# Patient Record
Sex: Male | Born: 1970 | Race: White | Hispanic: No | Marital: Married | Smoking: Never smoker
Health system: Southern US, Community
[De-identification: ages and names within clinical notes are randomized; demographics above are authoritative.]

## PROBLEM LIST (undated history)

## (undated) DIAGNOSIS — I10 Essential (primary) hypertension: Secondary | ICD-10-CM

## (undated) HISTORY — PX: SHOULDER SURGERY: SHX246

## (undated) HISTORY — PX: APPENDECTOMY: SHX54

---

## 2015-01-02 ENCOUNTER — Emergency Department (HOSPITAL_COMMUNITY)
Admission: EM | Admit: 2015-01-02 | Discharge: 2015-01-02 | Disposition: A | Discharge status: Home / Self Care | Payer: BC Managed Care – PPO | Attending: Emergency Medicine | Admitting: Emergency Medicine

## 2015-01-02 ENCOUNTER — Emergency Department (HOSPITAL_COMMUNITY): Payer: BC Managed Care – PPO

## 2015-01-02 ENCOUNTER — Encounter (HOSPITAL_COMMUNITY): Payer: Self-pay | Admitting: Emergency Medicine

## 2015-01-02 DIAGNOSIS — S4991XA Unspecified injury of right shoulder and upper arm, initial encounter: Secondary | ICD-10-CM | POA: Diagnosis present

## 2015-01-02 DIAGNOSIS — S43004A Unspecified dislocation of right shoulder joint, initial encounter: Secondary | ICD-10-CM | POA: Diagnosis not present

## 2015-01-02 DIAGNOSIS — Z79899 Other long term (current) drug therapy: Secondary | ICD-10-CM | POA: Diagnosis not present

## 2015-01-02 DIAGNOSIS — T1490XA Injury, unspecified, initial encounter: Secondary | ICD-10-CM

## 2015-01-02 DIAGNOSIS — W1839XA Other fall on same level, initial encounter: Secondary | ICD-10-CM | POA: Diagnosis not present

## 2015-01-02 DIAGNOSIS — M25519 Pain in unspecified shoulder: Secondary | ICD-10-CM

## 2015-01-02 DIAGNOSIS — I10 Essential (primary) hypertension: Secondary | ICD-10-CM | POA: Insufficient documentation

## 2015-01-02 DIAGNOSIS — N289 Disorder of kidney and ureter, unspecified: Secondary | ICD-10-CM | POA: Insufficient documentation

## 2015-01-02 DIAGNOSIS — Y999 Unspecified external cause status: Secondary | ICD-10-CM | POA: Insufficient documentation

## 2015-01-02 DIAGNOSIS — Y9289 Other specified places as the place of occurrence of the external cause: Secondary | ICD-10-CM | POA: Insufficient documentation

## 2015-01-02 DIAGNOSIS — Y9302 Activity, running: Secondary | ICD-10-CM | POA: Diagnosis not present

## 2015-01-02 HISTORY — DX: Essential (primary) hypertension: I10

## 2015-01-02 LAB — CBC WITH DIFFERENTIAL/PLATELET
BASOS PCT: 1 % (ref 0–1)
Basophils Absolute: 0 10*3/uL (ref 0.0–0.1)
EOS ABS: 0.3 10*3/uL (ref 0.0–0.7)
Eosinophils Relative: 4 % (ref 0–5)
HCT: 53.2 % — ABNORMAL HIGH (ref 39.0–52.0)
HEMOGLOBIN: 18.5 g/dL — AB (ref 13.0–17.0)
Lymphocytes Relative: 36 % (ref 12–46)
Lymphs Abs: 3 10*3/uL (ref 0.7–4.0)
MCH: 31 pg (ref 26.0–34.0)
MCHC: 34.8 g/dL (ref 30.0–36.0)
MCV: 89.1 fL (ref 78.0–100.0)
Monocytes Absolute: 0.5 10*3/uL (ref 0.1–1.0)
Monocytes Relative: 6 % (ref 3–12)
Neutro Abs: 4.4 10*3/uL (ref 1.7–7.7)
Neutrophils Relative %: 53 % (ref 43–77)
Platelets: 293 10*3/uL (ref 150–400)
RBC: 5.97 MIL/uL — ABNORMAL HIGH (ref 4.22–5.81)
RDW: 12.4 % (ref 11.5–15.5)
WBC: 8.2 10*3/uL (ref 4.0–10.5)

## 2015-01-02 LAB — I-STAT CHEM 8, ED
BUN: 23 mg/dL — AB (ref 6–20)
Calcium, Ion: 1.2 mmol/L (ref 1.12–1.23)
Chloride: 100 mmol/L — ABNORMAL LOW (ref 101–111)
Creatinine, Ser: 1.6 mg/dL — ABNORMAL HIGH (ref 0.61–1.24)
Glucose, Bld: 121 mg/dL — ABNORMAL HIGH (ref 65–99)
HEMATOCRIT: 58 % — AB (ref 39.0–52.0)
HEMOGLOBIN: 19.7 g/dL — AB (ref 13.0–17.0)
Potassium: 4.2 mmol/L (ref 3.5–5.1)
SODIUM: 140 mmol/L (ref 135–145)
TCO2: 26 mmol/L (ref 0–100)

## 2015-01-02 MED ORDER — SODIUM CHLORIDE 0.9 % IV BOLUS (SEPSIS)
1000.0000 mL | Freq: Once | INTRAVENOUS | Status: AC
  Administered 2015-01-02: 1000 mL via INTRAVENOUS

## 2015-01-02 MED ORDER — PROPOFOL 10 MG/ML IV BOLUS
100.0000 mg | Freq: Once | INTRAVENOUS | Status: AC
  Administered 2015-01-02: 50 mg via INTRAVENOUS
  Filled 2015-01-02: qty 1

## 2015-01-02 MED ORDER — PROPOFOL 10 MG/ML IV BOLUS
INTRAVENOUS | Status: AC | PRN
  Administered 2015-01-02: 25 mg via INTRAVENOUS

## 2015-01-02 MED ORDER — OXYCODONE-ACETAMINOPHEN 5-325 MG PO TABS: 1.0000 | ORAL_TABLET | ORAL | Status: DC | PRN

## 2015-01-02 MED ORDER — NAPROXEN 500 MG PO TABS: 500.0000 mg | ORAL_TABLET | Freq: Two times a day (BID) | ORAL | Status: AC

## 2015-01-02 MED ORDER — SODIUM CHLORIDE 0.9 % IV SOLN
Freq: Once | INTRAVENOUS | Status: AC
  Administered 2015-01-02: 75 mL/h via INTRAVENOUS

## 2015-01-02 MED ORDER — FENTANYL CITRATE (PF) 100 MCG/2ML IJ SOLN
100.0000 ug | Freq: Once | INTRAMUSCULAR | Status: AC
  Administered 2015-01-02: 100 ug via INTRAVENOUS
  Filled 2015-01-02: qty 2

## 2015-01-02 MED ORDER — MIDAZOLAM HCL 2 MG/2ML IJ SOLN
2.0000 mg | Freq: Once | INTRAMUSCULAR | Status: AC
  Administered 2015-01-02: 2 mg via INTRAVENOUS
  Filled 2015-01-02: qty 2

## 2015-01-02 MED ORDER — ONDANSETRON HCL 4 MG/2ML IJ SOLN
4.0000 mg | Freq: Once | INTRAMUSCULAR | Status: AC
  Administered 2015-01-02: 4 mg via INTRAVENOUS
  Filled 2015-01-02: qty 2

## 2015-01-02 NOTE — ED Notes (Signed)
MD at bedside. 

## 2015-01-02 NOTE — ED Notes (Signed)
Printed patient a copy of his lab results from today and left on bedside table.

## 2015-01-02 NOTE — ED Notes (Signed)
Patient transported to X-ray 

## 2015-01-02 NOTE — ED Provider Notes (Signed)
CSN: [id number]     Arrival date & time 01/02/15  1501 History   First MD Initiated Contact with Patient 01/02/15 1503     Chief Complaint  Patient presents with  . Fall  . Shoulder Pain     (Consider location/radiation/quality/duration/timing/severity/associated sxs/prior Treatment) HPI  The pt is a 44 y/o male - remote history of shoulder fracture in the past - had a fall today when he was running away from [place] - he tripped forward and FOOSH injury falling with acute onset of pain to the R shoulder with deformity.  Pain is constant, worse with ROM and assocaited with tingling in the fingertips.  No tx pta.  Past Medical History  Diagnosis Date  . Hypertension    Past Surgical History  Procedure Laterality Date  . Appendectomy     No family history on file. History  Substance Use Topics  . Smoking status: Never Smoker   . Smokeless tobacco: Not on file  . Alcohol Use: No    Review of Systems  Musculoskeletal: Positive for joint swelling. Negative for back pain and neck pain.  Skin: Negative for rash and wound.  Neurological: Positive for numbness. Negative for weakness and headaches.      Allergies  Review of patient's allergies indicates no known allergies.  Home Medications   Prior to Admission medications   Medication Sig Start Date End Date Taking? Authorizing Provider  lisinopril (PRINIVIL,ZESTRIL) 10 MG tablet Take 10 mg by mouth daily.   Yes Historical Provider, MD  Multiple Vitamin (MULTIVITAMIN WITH MINERALS) TABS tablet Take 1 tablet by mouth daily.   Yes Historical Provider, MD  Omega-3 Fatty Acids (FISH OIL PO) Take 5 capsules by mouth daily.   Yes Historical Provider, MD  naproxen (NAPROSYN) 500 MG tablet Take 1 tablet (500 mg total) by mouth 2 (two) times daily with a meal. 01/02/15   Eber HongBrian Carolan Avedisian, MD  oxyCODONE-acetaminophen (PERCOCET) 5-325 MG per tablet Take 1 tablet by mouth every 4 (four) hours as needed. 01/02/15   Eber HongBrian Onie Kasparek, MD   BP 117/81  mmHg  Pulse 74  Temp(Src) 97.9 F (36.6 C) (Oral)  Resp 19  Ht 6' (1.829 m)  Wt 213 lb 3 oz (96.701 kg)  BMI 28.91 kg/m2  SpO2 99% Physical Exam  Constitutional: He appears well-developed and well-nourished. No distress.  HENT:  Head: Normocephalic and atraumatic.  Mouth/Throat: Oropharynx is clear and moist. No oropharyngeal exudate.  Eyes: Conjunctivae and EOM are normal. Pupils are equal, round, and reactive to light. Right eye exhibits no discharge. Left eye exhibits no discharge. No scleral icterus.  Neck: Normal range of motion. Neck supple. No JVD present. No thyromegaly present.  Cardiovascular: Normal rate, regular rhythm, normal heart sounds and intact distal pulses.  Exam reveals no gallop and no friction rub.   No murmur heard. Pulmonary/Chest: Effort normal and breath sounds normal. No respiratory distress. He has no wheezes. He has no rales.  Musculoskeletal: He exhibits tenderness ( R shoulder with ttp and dec ROM 2/2 pain - deformity present). He exhibits no edema.  Lymphadenopathy:    He has no cervical adenopathy.  Neurological: He is alert. Coordination normal.  Normal strength at hand and sensation of all dermatomes and nerve distrbution of the hand  Skin: Skin is warm and dry. No rash noted. No erythema.  Psychiatric: He has a normal mood and affect. His behavior is normal.  Nursing note and vitals reviewed.   ED Course  Procedures (including critical care  time) Labs Review Labs Reviewed  CBC WITH DIFFERENTIAL/PLATELET - Abnormal; Notable for the following:    RBC 5.97 (*)    Hemoglobin 18.5 (*)    HCT 53.2 (*)    All other components within normal limits  I-STAT CHEM 8, ED - Abnormal; Notable for the following:    Chloride 100 (*)    BUN 23 (*)    Creatinine, Ser 1.60 (*)    Glucose, Bld 121 (*)    Hemoglobin 19.7 (*)    HCT 58.0 (*)    All other components within normal limits    Imaging Review Dg Shoulder Right  01/02/2015   CLINICAL DATA:   Status post right shoulder dislocation. Interval reduction.  EXAM: RIGHT SHOULDER - 2+ VIEW  COMPARISON:  None.  FINDINGS: Interval successful reduction of glenohumeral dislocation. No acute fracture or persistent dislocation. Slight flattening of the superolateral posterior humeral head likely reflecting an a Hill-Sachs lesion. Degenerative changes of the acromioclavicular joint.  IMPRESSION: Successful reduction of right glenohumeral dislocation.   Electronically Signed   By: Elige Ko   On: 01/02/2015 16:46   Dg Shoulder Right  01/02/2015   CLINICAL DATA:  Right shoulder pain.  Fell on right shoulder.  EXAM: RIGHT SHOULDER - 2+ VIEW  COMPARISON:  None.  FINDINGS: Anterior and inferior dislocation of the right shoulder. Right AC joint appears to be intact. Visualized right ribs are intact. No definite fracture.  IMPRESSION: Anterior dislocation of the right shoulder.   Electronically Signed   By: Richarda Overlie M.D.   On: 01/02/2015 15:34     EKG Interpretation None      MDM   Final diagnoses:  Trauma  Shoulder pain  Dislocation of right shoulder joint, initial encounter  Renal insufficiency    Likely dislocation - needs imaging, pain meds, sedation.    Procedure:  Dislocation Reduction of right Shoulder  Consent:  Description of the procedures as well as Risks of procedure as well as the alternatives and risks of each were explained to the (patient/caregiver).  who has verbally expressed their understanding.   written consent given by  the patient.  Imaging reviewed, anatomic site of dislocation identified and marked, Patient identity confirmed by arm band and with hospital identification number as well as verbally with patient.  Time Out performed .  Patient was prepped and draped in the usual sterile fashion. Sedation used Yes.  .  Sedation type: Moderate.  Type of Sedation used: fentanyl, midazolam and propofol. Total sedation time: 15 minutes.  Reduction method: elvation of arm /  traction.  Vitals were monitored through the procedure.  Complications: none.  Pt tolerated the procedure without complaints and returned to baseline without difficulty.  Post reduction images were obtained confirming successful reduction of dislocation.  Immobilization applied, Neurovascular status reevaluated and is good with normal pulses, sensation and good capillary refill of the affected extremity.  I have personally viewed and interpreted the imaging and agree with radiologist interpretation.    No signs of fracture on postreduction films. Patient given instructions on renal insufficiency and need  for recheck, IV fluids have been given.  Meds given in ED:  Medications  0.9 %  sodium chloride infusion ( Intravenous Stopped 01/02/15 1702)  fentaNYL (SUBLIMAZE) injection 100 mcg (100 mcg Intravenous Given 01/02/15 1550)  midazolam (VERSED) injection 2 mg (2 mg Intravenous Given 01/02/15 1600)  propofol (DIPRIVAN) 10 mg/mL bolus/IV push 100 mg (0 mg Intravenous Stopped 01/02/15 1602)  ondansetron (ZOFRAN) injection  4 mg (4 mg Intravenous Given 01/02/15 1557)  propofol (DIPRIVAN) 10 mg/mL bolus/IV push ( Intravenous Stopped 01/02/15 1606)  sodium chloride 0.9 % bolus 1,000 mL (1,000 mLs Intravenous Bolus from Bag 01/02/15 1702)    New Prescriptions   NAPROXEN (NAPROSYN) 500 MG TABLET    Take 1 tablet (500 mg total) by mouth 2 (two) times daily with a meal.   OXYCODONE-ACETAMINOPHEN (PERCOCET) 5-325 MG PER TABLET    Take 1 tablet by mouth every 4 (four) hours as needed.      Eber Hong, MD 01/02/15 [phone]

## 2015-01-02 NOTE — ED Notes (Signed)
Patient was running and fell onto his right shoulder. Shoulder appears to be dislocated.

## 2015-01-02 NOTE — Discharge Instructions (Signed)
Please call your doctor for a followup appointment within 24-48 hours. When you talk to your doctor please let them know that you were seen in the emergency department and have them acquire all of your records so that they can discuss the findings with you and formulate a treatment plan to fully care for your new and ongoing problems. ° °

## 2016-03-25 IMAGING — CR DG SHOULDER 2+V*R*
2 series · 2 of 2 positions shown · non-contrast
Comparison: None.

CLINICAL DATA: Status post right shoulder dislocation. Interval
reduction.

EXAM:
RIGHT SHOULDER - 2+ VIEW

[x shoulder ap right (1 of 2)]
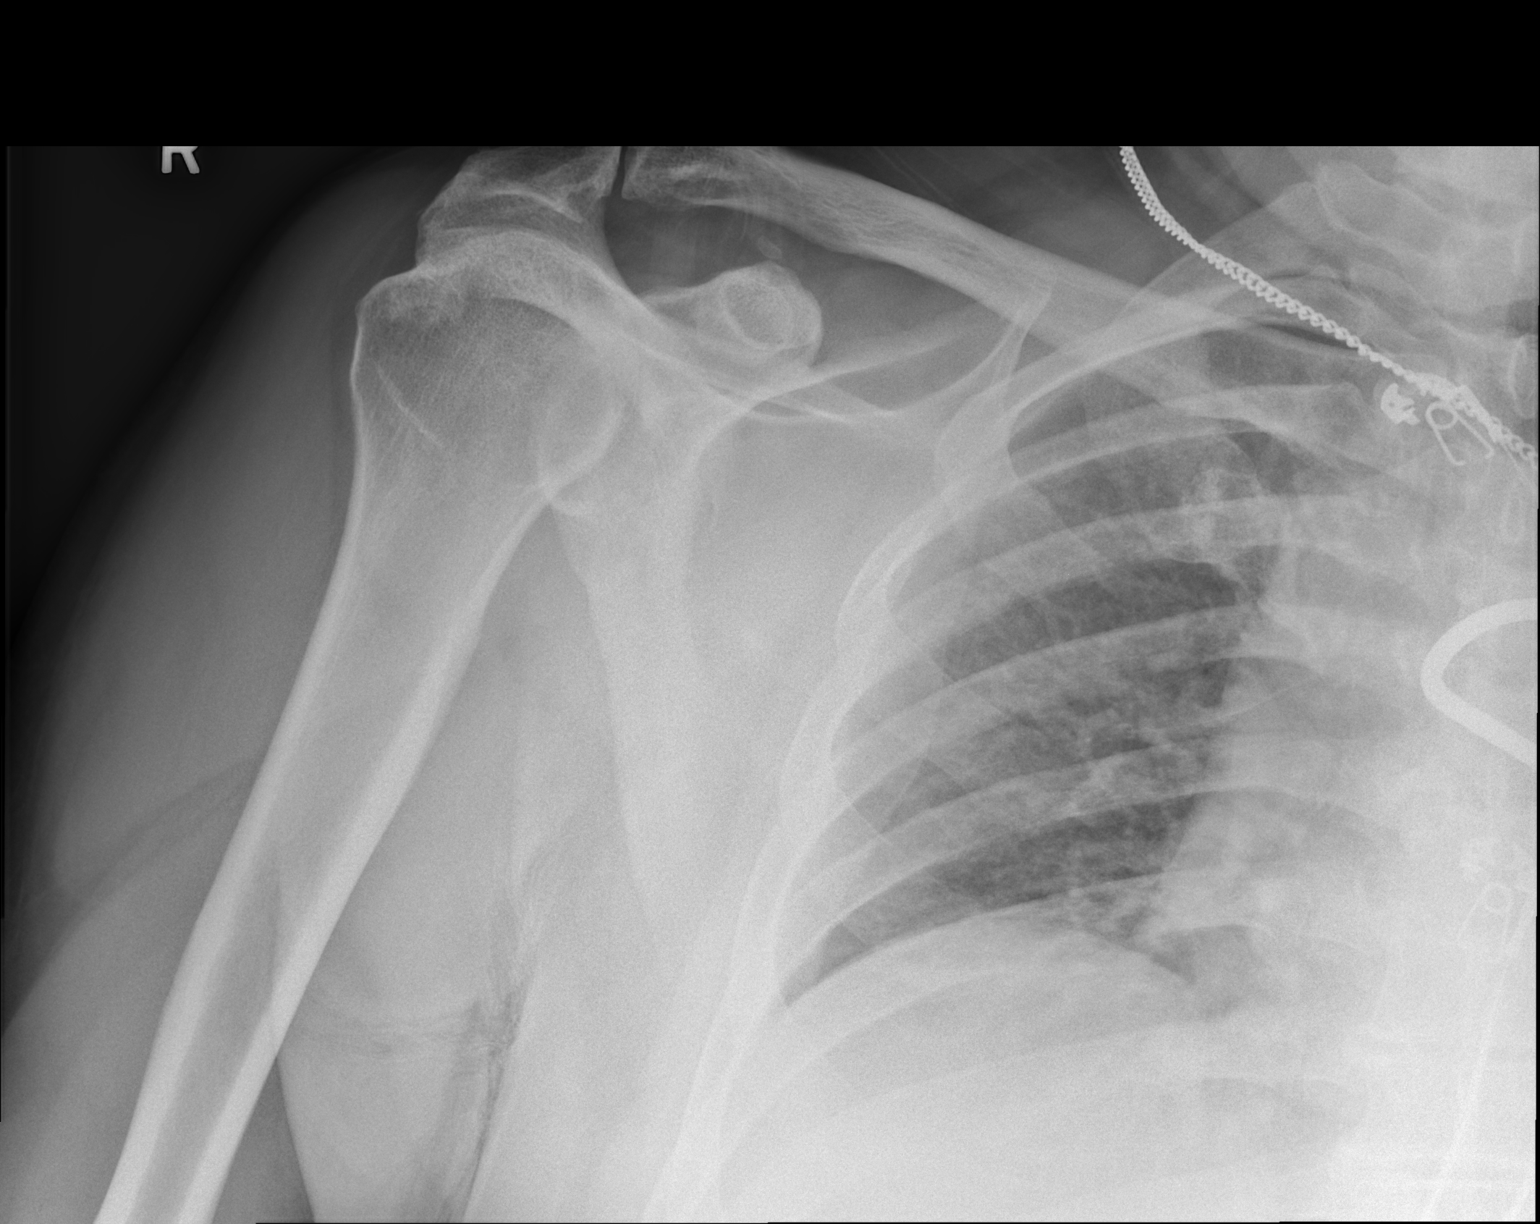

[x shoulder ap right (2 of 2)]
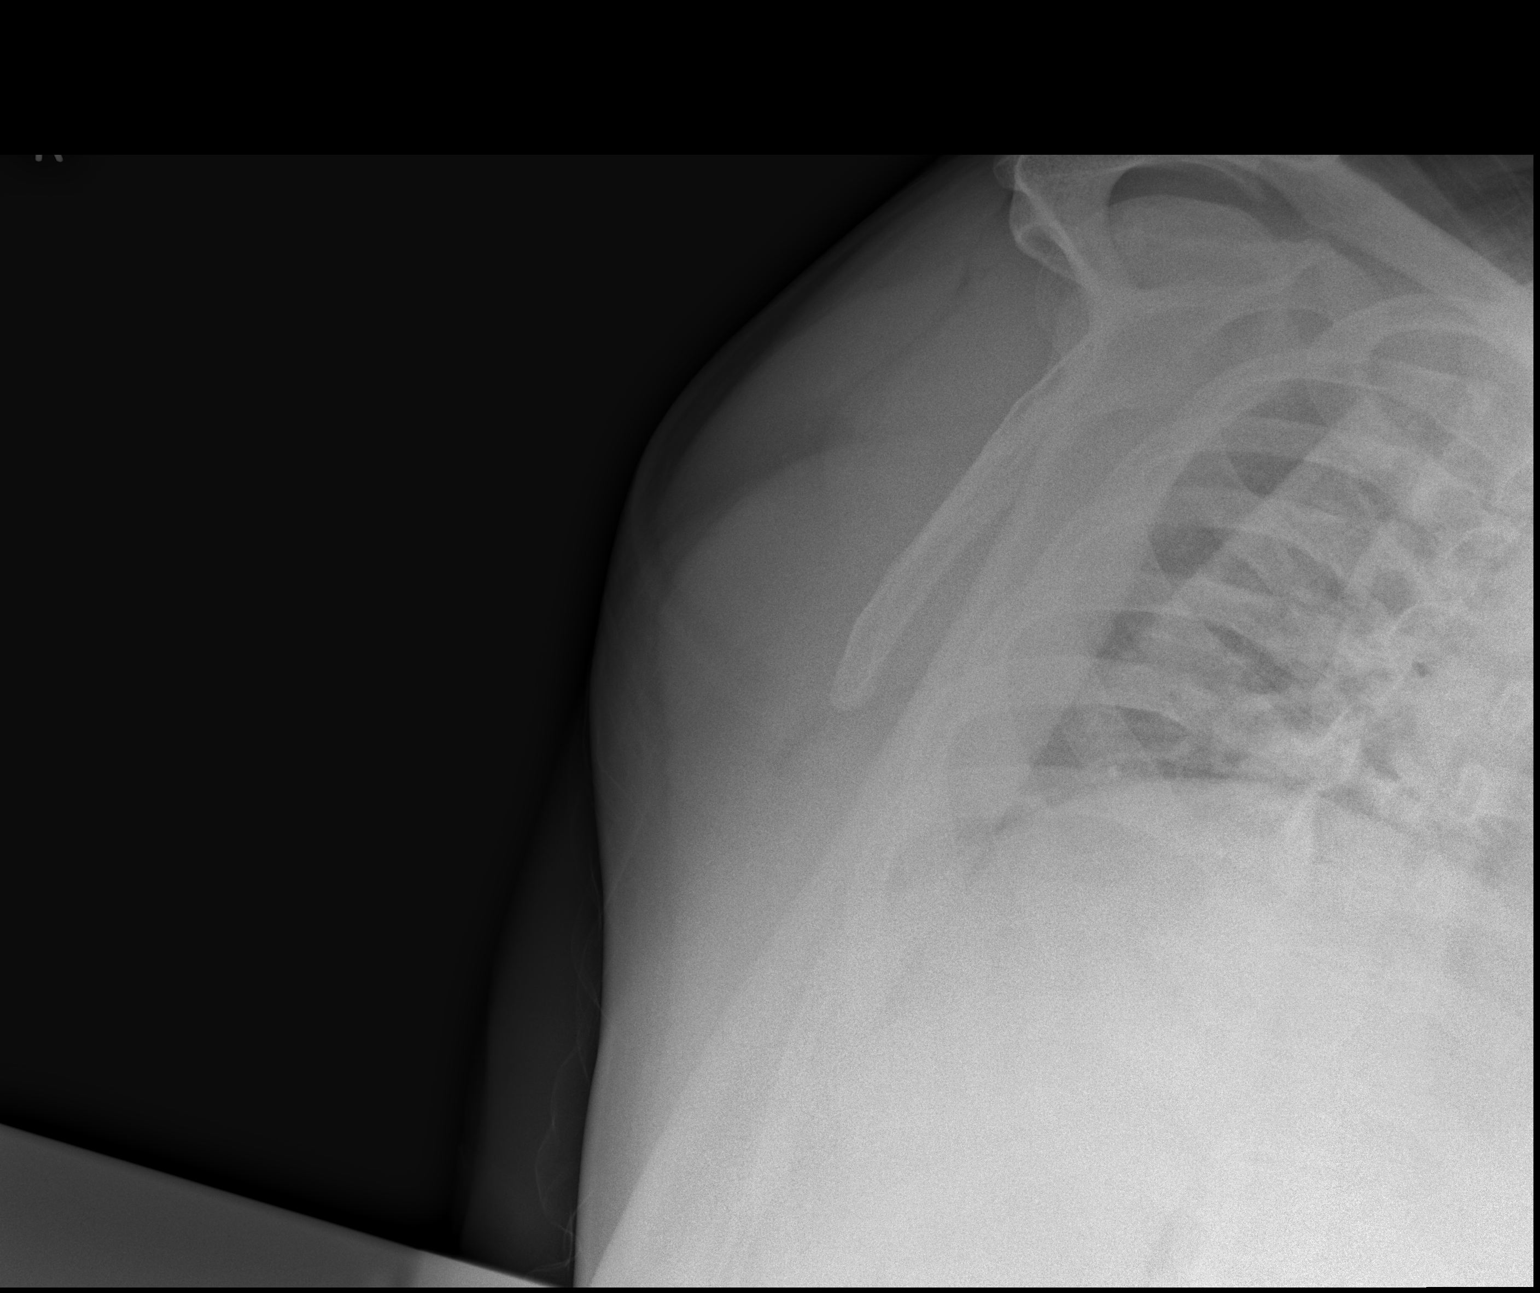

[2 of 2 positions shown; findings below may reference images not displayed]

FINDINGS: Interval successful reduction of glenohumeral dislocation. No acute
fracture or persistent dislocation. Slight flattening of the
superolateral posterior humeral head likely reflecting an a
Hill-Sachs lesion. Degenerative changes of the acromioclavicular
joint.
IMPRESSION: Successful reduction of right glenohumeral dislocation.

## 2020-12-05 LAB — COLOGUARD: COLOGUARD: NEGATIVE

## 2023-11-16 ENCOUNTER — Other Ambulatory Visit: Payer: Self-pay | Admitting: Oncology

## 2023-11-16 NOTE — Progress Notes (Unsigned)
 [facility]  [address] [place],  [place]  [address] [phone]  Clinic Day:  11/17/2023  Referring physician: Freya Jesus, *   HISTORY OF PRESENT ILLNESS:  The patient is a 53 y.o. male who I was asked to consult upon for polycythemia.  Recent labs showed an elevated hemoglobin and hematocrit of 20.1 and 58, respectively.  Of note, he has been taking testosterone for 10+ years.  In the past, attempts were made to donate his blood to the [facility].  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  The patient claims the dose of his testosterone was recently increased.  Due to his high hemoglobin, it is being contemplated whether his doses will be spaced out to once every 10 days.  He denies having undergone a previous splenectomy.  He denies a smoking history.  Despite his polycythemia, he denies having vision changes, headaches, or other hyperviscosity-related symptoms.  Overall, he denies having any particular changes in his health over these past months.  PAST MEDICAL HISTORY:   Past Medical History:  Diagnosis Date   Hypertension    PAST SURGICAL HISTORY:   Past Surgical History:  Procedure Laterality Date   APPENDECTOMY     SHOULDER SURGERY Right    CURRENT MEDICATIONS:   Current Outpatient Medications  Medication Sig Dispense Refill   testosterone cypionate (DEPOTESTOSTERONE CYPIONATE) 200 MG/ML injection Inject 200 mg into the muscle once a week.     lisinopril (PRINIVIL,ZESTRIL) 10 MG tablet Take 10 mg by mouth daily.     Multiple Vitamin (MULTIVITAMIN WITH MINERALS) TABS tablet Take 1 tablet by mouth daily.     naproxen  (NAPROSYN ) 500 MG tablet Take 1 tablet (500 mg total) by mouth 2 (two) times daily with a meal. 30 tablet 0   Omega-3 Fatty Acids (FISH OIL PO) Take 5 capsules by mouth daily.     oxyCODONE -acetaminophen  (PERCOCET) 5-325 MG per tablet Take 1 tablet  by mouth every 4 (four) hours as needed. 20 tablet 0   No current facility-administered medications for this visit.   ALLERGIES:  No Known Allergies  FAMILY HISTORY:   Family History  Problem Relation Age of Onset   Heart failure Mother    Heart disease Mother    Throat cancer Father    SOCIAL HISTORY:  The patient was born and raised in [place].  He lives in the [place] community with his wife of 28 years.  He has 4 children and 5 grandchildren.  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  There is no history of alcoholism or tobacco abuse.  REVIEW OF SYSTEMS:  Review of Systems  Constitutional:  Negative for fatigue, fever and unexpected weight change.  Respiratory:  Negative for chest tightness, cough, hemoptysis and shortness of breath.   Cardiovascular:  Negative for chest pain and palpitations.  Gastrointestinal:  Negative for abdominal distention, abdominal pain, blood in stool, constipation, diarrhea, nausea and vomiting.  Genitourinary:  Negative for dysuria, frequency and hematuria.   Musculoskeletal:  Positive for arthralgias. Negative for back pain and myalgias.  Skin:  Negative for itching and rash.  Neurological:  Negative for dizziness, headaches and light-headedness.  Psychiatric/Behavioral:  Negative for depression and suicidal ideas. The patient is not nervous/anxious.     PHYSICAL EXAM:  Blood pressure (!) 131/90, pulse 84, temperature 98.3 F (36.8 C), temperature source Oral, resp. rate 16, height 5\' 11"  (1.803 m), weight 216 lb 6.4  oz (98.2 kg), SpO2 97%. Wt Readings from Last 3 Encounters:  11/17/23 216 lb 6.4 oz (98.2 kg)  01/02/15 213 lb 3 oz (96.7 kg)   Body mass index is 30.18 kg/m.  Performance status (ECOG): 0 - Asymptomatic Physical Exam Constitutional:      Appearance: Normal appearance. He is not ill-appearing.     Comments: Facial plethora is appreciated.  HENT:     Mouth/Throat:     Mouth: Mucous membranes are moist.      Pharynx: Oropharynx is clear. No oropharyngeal exudate or posterior oropharyngeal erythema.  Cardiovascular:     Rate and Rhythm: Normal rate and regular rhythm.     Heart sounds: No murmur heard.    No friction rub. No gallop.  Pulmonary:     Effort: Pulmonary effort is normal. No respiratory distress.     Breath sounds: Normal breath sounds. No wheezing, rhonchi or rales.  Abdominal:     General: Bowel sounds are normal. There is no distension.     Palpations: Abdomen is soft. There is no mass.     Tenderness: There is no abdominal tenderness.  Musculoskeletal:        General: No swelling.     Right lower leg: No edema.     Left lower leg: No edema.  Lymphadenopathy:     Cervical: No cervical adenopathy.     Upper Body:     Right upper body: No supraclavicular or axillary adenopathy.     Left upper body: No supraclavicular or axillary adenopathy.     Lower Body: No right inguinal adenopathy. No left inguinal adenopathy.  Skin:    General: Skin is warm.     Coloration: Skin is not jaundiced.     Findings: No lesion or rash.  Neurological:     General: No focal deficit present.     Mental Status: He is alert and oriented to person, place, and time. Mental status is at baseline.  Psychiatric:        Mood and Affect: Mood normal.        Behavior: Behavior normal.        Thought Content: Thought content normal.   LABS:      Latest Ref Rng & Units 11/17/2023   10:56 AM 01/02/2015    3:49 PM 01/02/2015    3:38 PM  CBC  WBC 4.0 - 10.5 K/uL 5.8   8.2   Hemoglobin 13.0 - 17.0 g/dL 16.1  09.6  04.5   Hematocrit 39.0 - 52.0 % 55.5  58.0  53.2   Platelets 150 - 400 K/uL 228   293    ASSESSMENT & PLAN:  A 53 y.o. 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  In clinic today, I did talk to this patient about routinely donating blood to the [facility], with the goal being to bring his hemoglobin under 18.  Our office  did get in contact with the [facility], who has no problem collecting blood from people with testosterone-induced polycythemia.  As long as his hemoglobin is kept below 18, I have no problem with him continuing testosterone at whatever schedule he and his primary care physician decide.  As he has no other pressing hematologic issues, I do feel comfortable turning his care back over to his primary care office.  I would not have a problem seeing him in the future if new hematologic issues arise that require repeat  clinical attention.  The patient understands all the plans discussed today and is in agreement with them.  I do appreciate Street, Renford Cartwright, * for his new consult.   Gabriana Wilmott Felicia Horde, MD

## 2023-11-17 ENCOUNTER — Encounter: Payer: Self-pay | Admitting: Oncology

## 2023-11-17 ENCOUNTER — Inpatient Hospital Stay: Attending: Oncology | Admitting: Oncology

## 2023-11-17 ENCOUNTER — Inpatient Hospital Stay

## 2023-11-17 ENCOUNTER — Other Ambulatory Visit: Payer: Self-pay

## 2023-11-17 VITALS — BP 131/90 | HR 84 | Temp 98.3°F | Resp 16 | Ht 71.0 in | Wt 216.4 lb

## 2023-11-17 DIAGNOSIS — R232 Flushing: Secondary | ICD-10-CM

## 2023-11-17 DIAGNOSIS — D751 Secondary polycythemia: Secondary | ICD-10-CM

## 2023-11-17 LAB — CBC WITH DIFFERENTIAL (CANCER CENTER ONLY)
Abs Immature Granulocytes: 0.01 10*3/uL (ref 0.00–0.07)
Basophils Absolute: 0 10*3/uL (ref 0.0–0.1)
Basophils Relative: 1 %
Eosinophils Absolute: 0.3 10*3/uL (ref 0.0–0.5)
Eosinophils Relative: 6 %
HCT: 55.5 % — ABNORMAL HIGH (ref 39.0–52.0)
Hemoglobin: 19.6 g/dL — ABNORMAL HIGH (ref 13.0–17.0)
Immature Granulocytes: 0 %
Immature Platelet Fraction: 3.2 % (ref 1.2–8.6)
Lymphocytes Relative: 35 %
Lymphs Abs: 2 10*3/uL (ref 0.7–4.0)
MCH: 31.2 pg (ref 26.0–34.0)
MCHC: 35.3 g/dL (ref 30.0–36.0)
MCV: 88.2 fL (ref 80.0–100.0)
Monocytes Absolute: 0.4 10*3/uL (ref 0.1–1.0)
Monocytes Relative: 7 %
Neutro Abs: 3 10*3/uL (ref 1.7–7.7)
Neutrophils Relative %: 51 %
Platelet Count: 228 10*3/uL (ref 150–400)
RBC: 6.29 MIL/uL — ABNORMAL HIGH (ref 4.22–5.81)
RDW: 12.2 % (ref 11.5–15.5)
WBC Count: 5.8 10*3/uL (ref 4.0–10.5)
nRBC: 0 % (ref 0.0–0.2)

## 2024-03-27 ENCOUNTER — Ambulatory Visit (HOSPITAL_BASED_OUTPATIENT_CLINIC_OR_DEPARTMENT_OTHER): Admit: 2024-03-27 | Discharge: 2024-03-27 | Disposition: A | Admitting: Radiology

## 2024-03-27 ENCOUNTER — Encounter (HOSPITAL_BASED_OUTPATIENT_CLINIC_OR_DEPARTMENT_OTHER): Payer: Self-pay

## 2024-03-27 ENCOUNTER — Ambulatory Visit (HOSPITAL_BASED_OUTPATIENT_CLINIC_OR_DEPARTMENT_OTHER): Admission: EM | Admit: 2024-03-27 | Discharge: 2024-03-27 | Disposition: A | Discharge status: Home / Self Care

## 2024-03-27 DIAGNOSIS — S4992XA Unspecified injury of left shoulder and upper arm, initial encounter: Secondary | ICD-10-CM | POA: Diagnosis not present

## 2024-03-27 DIAGNOSIS — M25512 Pain in left shoulder: Secondary | ICD-10-CM

## 2024-03-27 NOTE — Discharge Instructions (Signed)
 Your x-ray did show some moderate degenerative changes in the left shoulder.  You do have some spurs.  There also appears to be some chronic rotator cuff arthropathy.  There is no fracture or dislocation.  There may be a small fragment or avulsion fracture and possible ligament injury to the shoulder.  I would follow-up with orthopedics as soon as possible.  We have placed you in a shoulder immobilizer to help stabilize the area.  Recommend icing the area is much as possible.  You can take 800 mg of ibuprofen every 8 hours for pain as needed.

## 2024-03-27 NOTE — ED Provider Notes (Signed)
 [facility]    CSN: [id number] Arrival date & time: 03/27/24  1608      History   Chief Complaint Chief Complaint  Patient presents with   Shoulder Injury    HPI Edwin Gardner is a 53 y.o. male.   Pt is a 53 year old male that presents with left shoulder pain. States pulling a large bar toward his body. Using left arm and state he felt/heard a snap to the top of his shoulder and lateral aspect of deltoid area. Injury approx 1pm.   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 Authorizing Provider  lisinopril-hydrochlorothiazide (ZESTORETIC) 20-12.5 MG tablet Take 1 tablet by mouth daily. 02/24/24  Yes [provider]  lisinopril (PRINIVIL,ZESTRIL) 10 MG tablet Take 10 mg by mouth daily.    [provider]  Multiple Vitamin (MULTIVITAMIN WITH MINERALS) TABS tablet Take 1 tablet by mouth daily.    [provider]  naproxen  (NAPROSYN ) 500 MG tablet Take 1 tablet (500 mg total) by mouth 2 (two) times daily with a meal. 01/02/15   Cleotilde Rogue, MD  Omega-3 Fatty Acids (FISH OIL PO) Take 5 capsules by mouth daily.    [provider]  testosterone cypionate (DEPOTESTOSTERONE CYPIONATE) 200 MG/ML injection Inject 200 mg into the muscle once a week. 11/02/23   [provider]    Family History Family History  Problem Relation Age of Onset   Heart failure Mother    Heart disease Mother    Throat cancer Father     Social History Social History   Tobacco Use   Smoking status: Never  Vaping Use   Vaping status: Never Used  Substance Use Topics   Alcohol use: No   Drug use: Never      Allergies   Patient has no known allergies.   Review of Systems Review of Systems See HPI  Physical Exam Triage Vital Signs ED Triage Vitals  Encounter Vitals Group     BP 03/27/24 1636 (!) 152/101     Girls Systolic BP Percentile --      Girls Diastolic BP Percentile --      Boys Systolic BP Percentile --      Boys Diastolic BP Percentile --      Pulse Rate 03/27/24 1636 95     Resp 03/27/24 1636 20     Temp 03/27/24 1636 98.5 F (36.9 C)     Temp Source 03/27/24 1636 Oral     SpO2 03/27/24 1636 96 %     Weight --      Height --      Head Circumference --      Peak Flow --      Pain Score 03/27/24 1638 5     Pain Loc --      Pain Education --      Exclude from Growth Chart --    No data found.  Updated Vital Signs BP (!) 152/101 (BP Location: Right Arm)   Pulse 95   Temp 98.5 F (36.9 C) (Oral)   Resp 20  SpO2 96%   Visual Acuity Right Eye Distance:   Left Eye Distance:   Bilateral Distance:    Right Eye Near:   Left Eye Near:    Bilateral Near:     Physical Exam Constitutional:      Appearance: Normal appearance.  Pulmonary:     Effort: Pulmonary effort is normal.  Musculoskeletal:        General: Tenderness present. Normal range of motion.     Comments: Very limited range of motion to the left arm and shoulder area.  Pain mostly posterior shoulder.  Neurological:     Mental Status: He is alert.  Psychiatric:        Mood and Affect: Mood normal.      UC Treatments / Results  Labs (all labs ordered are listed, but only abnormal results are displayed) Labs Reviewed - No data to display  EKG   Radiology DG Shoulder Left Result Date: 03/27/2024 CLINICAL DATA:  Left shoulder injury while pulling a bar towards the body. Patient felt a snap to the top of the shoulder. EXAM: LEFT SHOULDER - 2+ VIEW COMPARISON:  None Available. FINDINGS: Moderate degenerative changes in the left shoulder with joint space narrowing and osteophyte formation  on the glenohumeral joint. Small subacromial spur formation. Loss of subacromial space may indicate chronic rotator cuff arthropathy. No acute displaced fracture or dislocation identified although there is focal calcification in the soft tissues at the greater tuberosity which could indicate calcific tendinosis or a displaced avulsion fragment. Consider possibility of ligamentous injury. IMPRESSION: 1. 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 2. Calcification adjacent to the greater tuberosity may represent calcific tendinosis or displaced avulsion fragment. Consider ligamentous injury. 3. Loss of subacromial space may indicate chronic rotator cuff arthropathy. Electronically Signed   By: Elsie Gravely M.D.   On: 03/27/2024 17:31    Procedures Procedures (including critical care time)  Medications Ordered in UC Medications - No data to display  Initial Impression / Assessment and Plan / UC Course  I have reviewed the triage vital signs and the nursing notes.  Pertinent labs & imaging results that were available during my care of the patient were reviewed by me and considered in my medical decision making (see chart for details).     Left shoulder pain- x-ray did show some moderate degenerative changes in the left shoulder. There are some spurs and  appears to be some chronic rotator cuff arthropathy.  There is no fracture or dislocation.  There may be a small fragment or avulsion fracture and possible ligament injury to the shoulder. Recommend  follow-up with orthopedics as soon as possible.  We have placed in a shoulder immobilizer to help stabilize the area.  Recommend icing the area is much as possible.  You can take 800 mg of ibuprofen every 8 hours for pain as needed.  Final Clinical Impressions(s) / UC Diagnoses   Final diagnoses:  Injury of left shoulder, initial encounter  Pain in joint of left shoulder     Discharge Instructions      Your x-ray did show some  moderate degenerative changes in the left shoulder.  You do have some spurs.  There also appears to be some chronic rotator cuff arthropathy.  There is no fracture or dislocation.  There may be a small fragment or avulsion fracture and possible ligament injury to the shoulder.  I would follow-up with orthopedics as soon as possible.  We have placed you in a shoulder immobilizer  to help stabilize the area.  Recommend icing the area is much as possible.  You can take 800 mg of ibuprofen every 8 hours for pain as needed.     ED Prescriptions   None    PDMP not reviewed this encounter.   Adah Wilbert LABOR, FNP 03/28/24 1344

## 2024-03-27 NOTE — ED Triage Notes (Signed)
 States pulling a large bar toward his body. Using left arm and state he felt/heard a snap to the top of his shoulder and lateral aspect of deltoid area. Injury approx 1pm.

## 2024-03-27 NOTE — ED Notes (Signed)
 Provided patient with CD ROM of x-rays.
